# Patient Record
Sex: Female | Born: 1960 | Race: White | Hispanic: No | Marital: Married | Smoking: Never smoker
Health system: Southern US, Community
[De-identification: ages and names within clinical notes are randomized; demographics above are authoritative.]

## PROBLEM LIST (undated history)

## (undated) DIAGNOSIS — J302 Other seasonal allergic rhinitis: Secondary | ICD-10-CM

## (undated) HISTORY — PX: TONSILLECTOMY: SUR1361

## (undated) HISTORY — PX: OOPHORECTOMY: SHX86

## (undated) HISTORY — PX: BREAST SURGERY: SHX581

---

## 2021-11-30 ENCOUNTER — Encounter (HOSPITAL_BASED_OUTPATIENT_CLINIC_OR_DEPARTMENT_OTHER): Payer: Self-pay | Admitting: *Deleted

## 2021-11-30 ENCOUNTER — Emergency Department (HOSPITAL_BASED_OUTPATIENT_CLINIC_OR_DEPARTMENT_OTHER): Payer: 59

## 2021-11-30 ENCOUNTER — Emergency Department (HOSPITAL_BASED_OUTPATIENT_CLINIC_OR_DEPARTMENT_OTHER)
Admission: EM | Admit: 2021-11-30 | Discharge: 2021-12-01 | Disposition: A | Discharge status: Home / Self Care | Payer: 59 | Attending: Emergency Medicine | Admitting: Emergency Medicine

## 2021-11-30 ENCOUNTER — Other Ambulatory Visit: Payer: Self-pay

## 2021-11-30 DIAGNOSIS — Y92002 Bathroom of unspecified non-institutional (private) residence single-family (private) house as the place of occurrence of the external cause: Secondary | ICD-10-CM | POA: Insufficient documentation

## 2021-11-30 DIAGNOSIS — W182XXA Fall in (into) shower or empty bathtub, initial encounter: Secondary | ICD-10-CM | POA: Diagnosis not present

## 2021-11-30 DIAGNOSIS — S43014A Anterior dislocation of right humerus, initial encounter: Secondary | ICD-10-CM | POA: Insufficient documentation

## 2021-11-30 DIAGNOSIS — S43004A Unspecified dislocation of right shoulder joint, initial encounter: Secondary | ICD-10-CM

## 2021-11-30 DIAGNOSIS — R030 Elevated blood-pressure reading, without diagnosis of hypertension: Secondary | ICD-10-CM

## 2021-11-30 DIAGNOSIS — M25511 Pain in right shoulder: Secondary | ICD-10-CM | POA: Insufficient documentation

## 2021-11-30 DIAGNOSIS — W010XXA Fall on same level from slipping, tripping and stumbling without subsequent striking against object, initial encounter: Secondary | ICD-10-CM

## 2021-11-30 DIAGNOSIS — S4991XA Unspecified injury of right shoulder and upper arm, initial encounter: Secondary | ICD-10-CM | POA: Diagnosis present

## 2021-11-30 HISTORY — DX: Other seasonal allergic rhinitis: J30.2

## 2021-11-30 MED ORDER — HYDROCODONE-ACETAMINOPHEN 5-325 MG PO TABS
2.0000 | ORAL_TABLET | Freq: Once | ORAL | Status: AC
  Administered 2021-11-30: 2 via ORAL
  Filled 2021-11-30: qty 2

## 2021-11-30 MED ORDER — PROPOFOL 10 MG/ML IV BOLUS
INTRAVENOUS | Status: AC | PRN
  Administered 2021-11-30: 35 mg via INTRAVENOUS

## 2021-11-30 MED ORDER — PROPOFOL 10 MG/ML IV BOLUS
1.0000 mg/kg | Freq: Once | INTRAVENOUS | Status: AC
  Administered 2021-11-30: 72.6 mg via INTRAVENOUS
  Filled 2021-11-30: qty 20

## 2021-11-30 NOTE — Discharge Instructions (Addendum)
It was our pleasure to provide your ER care today - we hope that you feel better.  Wear sling/shoulder immobilizer for comfort/support. Ice/coldpack to sore area.   Take motrin or aleve as need for pain. You may also take hydrocodone as need for pain. No driving for the next 6 hours or when taking hydrocodone. Also, do not take tylenol or acetaminophen containing medication when taking hydrocodone.  Follow up with orthopedist in the next 1-2 weeks - call office tomorrow AM to arrange appointment.   Your blood pressure is high tonight, follow up with primary care doctor in the next couple weeks.   Return to ER if worse, new symptoms, new/intractable pain, numbness/weakness, or other concern.

## 2021-11-30 NOTE — ED Provider Notes (Signed)
[facility] EMERGENCY DEPARTMENT Provider Note   CSN: [id number] Arrival date & time: 11/30/21  2203     History  Chief Complaint  Patient presents with   Arm Injury    Kristine Morton is a 61 y.o. female.  Patient c/o injury to right shoulder/upper arm area today, just pta. Was in bathroom, feet slipped, tried to grab something to prevent fall with right arm/hand, and states all her weight and strain was placed onto right upper arm shoulder area. Did not fall all the way to ground, but did have acute pain in right upper arm/shoulder area - symptoms acute onset, constant, dull, non radiating. No head injury. No loc. Denies other pain or injury. No numbness/weakness. Skin intact.   The history is provided by the patient, the spouse and medical records.  Arm Injury Associated symptoms: no fever and no neck pain       Home Medications Prior to Admission medications   Not on File      Allergies    Patient has no known allergies.    Review of Systems   Review of Systems  Constitutional:  Negative for fever.  Respiratory:  Negative for shortness of breath.   Cardiovascular:  Negative for chest pain.  Musculoskeletal:  Negative for neck pain.       Right shoulder/upper arm pain/injury.   Skin:  Negative for rash.  Neurological:  Negative for weakness, numbness and headaches.   Physical Exam Updated Vital Signs BP (!) 170/85 (BP Location: Left Arm)    Pulse (!) 103    Temp 98.9 F (37.2 C) (Oral)    Resp 20    Ht 1.651 m (5\' 5" )    Wt 72.6 kg    SpO2 97%    BMI 26.63 kg/m  Physical Exam Vitals and nursing note reviewed.  Constitutional:      Appearance: Normal appearance. She is well-developed.  HENT:     Head: Atraumatic.     Nose: Nose normal.     Mouth/Throat:     Mouth: Mucous membranes are moist.  Eyes:     General: No scleral icterus.    Conjunctiva/sclera: Conjunctivae normal.     Pupils: Pupils are equal, round, and reactive to light.  Neck:      Trachea: No tracheal deviation.  Cardiovascular:     Rate and Rhythm: Normal rate.     Pulses: Normal pulses.  Pulmonary:     Effort: Pulmonary effort is normal. No respiratory distress.  Abdominal:     General: There is no distension.     Tenderness: There is no abdominal tenderness.  Genitourinary:    Comments: No cva tenderness.  Musculoskeletal:        General: No swelling.     Cervical back: Normal range of motion and neck supple. No rigidity or tenderness. No muscular tenderness.     Comments: CTLS spine, non tender, aligned, no step off. Tenderness right shoulder and upper arm diffusely. No gross soft tissue swelling noted. Compartments of arm are soft, not tense. Radial pulse palp. No other focal bony tenderness noted.   Skin:    General: Skin is warm and dry.     Findings: No rash.  Neurological:     Mental Status: She is alert.     Comments: Alert, speech normal.  RUE motor/sens grossly intact.   Psychiatric:        Mood and Affect: Mood normal.    ED Results / Procedures / Treatments  Labs (all labs ordered are listed, but only abnormal results are displayed) Labs Reviewed - No data to display  EKG None  Radiology No results found.  Procedures Procedures    Medications Ordered in ED Medications - No data to display  ED Course/ Medical Decision Making/ A&P                           Medical Decision Making Amount and/or Complexity of Data Reviewed Radiology: ordered.  Risk Prescription drug management.  Imaging ordered.   Reviewed nursing notes and prior charts for additional history. Spouse contributed to history. External reports reviewed.   Xrays reviewed/interpreted by me  - pnd  Considered parenteral med therapy, however, as no meds pta, will initially try oral pain control  therapy.   Hydrocodone po.   2300 xrays pending - signed out to Dr Blinda Leatherwood to check xrays, recheck pt, and dispo appropriately.            Final Clinical  Impression(s) / ED Diagnoses Final diagnoses:  None    Rx / DC Orders ED Discharge Orders     None         Cathren Laine, MD 11/30/21 2259

## 2021-11-30 NOTE — Sedation Documentation (Signed)
O2 increased to 6L  

## 2021-11-30 NOTE — ED Provider Notes (Signed)
Patient signed out to me by Dr. Denton Lank awaiting x-rays.  Patient with right shoulder injury after a near fall.  Physical Exam  BP (!) 171/84    Pulse 80    Temp 98.3 F (36.8 C) (Oral)    Resp 19    Ht 5\' 5"  (1.651 m)    Wt 72.6 kg    SpO2 100%    BMI 26.63 kg/m   Physical Exam Constitutional:      Appearance: Normal appearance.  HENT:     Head: Atraumatic.  Eyes:     Pupils: Pupils are equal, round, and reactive to light.  Cardiovascular:     Rate and Rhythm: Normal rate and regular rhythm.  Pulmonary:     Effort: Pulmonary effort is normal.     Breath sounds: Normal breath sounds.  Musculoskeletal:     Right shoulder: Deformity and tenderness present. Decreased range of motion.  Skin:    General: Skin is cool.     Findings: No bruising.  Neurological:     General: No focal deficit present.     Mental Status: She is alert.     Comments: Patient does have some subjective numbness on the outer portion of the deltoid    Procedures  .Sedation  Date/Time: 11/30/2021 11:44 PM Performed by: 12/02/2021, MD Authorized by: Gilda Crease, MD   Consent:    Consent obtained:  Written   Consent given by:  Patient   Risks discussed:  Allergic reaction, prolonged hypoxia resulting in organ damage, dysrhythmia, inadequate sedation, respiratory compromise necessitating ventilatory assistance and intubation, nausea and vomiting Universal protocol:    Procedure explained and questions answered to patient or proxy's satisfaction: yes     Relevant documents present and verified: yes     Test results available: yes     Imaging studies available: yes     Required blood products, implants, devices, and special equipment available: yes     Site/side marked: yes     Immediately prior to procedure, a time out was called: yes     Patient identity confirmed:  Verbally with patient Indications:    Procedure performed:  Dislocation reduction   Procedure necessitating sedation  performed by:  Physician performing sedation Pre-sedation assessment:    Time since last food or drink:  6   ASA classification: class 2 - patient with mild systemic disease     Mouth opening:  3 or more finger widths   Mallampati score:  III - soft palate, base of uvula visible   Neck mobility: normal     Pre-sedation assessments completed and reviewed: airway patency, cardiovascular function, hydration status, mental status, nausea/vomiting, pain level, respiratory function and temperature   Immediate pre-procedure details:    Reviewed: vital signs, relevant labs/tests and NPO status     Verified: bag valve mask available, emergency equipment available, intubation equipment available, IV patency confirmed, oxygen available, reversal medications available and suction available   Procedure details (see MAR for exact dosages):    Preoxygenation:  Nasal cannula   Sedation:  Propofol   Intended level of sedation: deep   Intra-procedure monitoring:  Blood pressure monitoring, continuous capnometry, frequent LOC assessments, frequent vital sign checks, continuous pulse oximetry and cardiac monitor   Intra-procedure events: none     Total Provider sedation time (minutes):  10 .Ortho Injury Treatment  Date/Time: 11/30/2021 11:46 PM Performed by: 12/02/2021, MD Authorized by: Gilda Crease, MD   Consent:  Consent obtained:  Written   Consent given by:  Patient   Risks discussed:  Fracture, nerve damage, restricted joint movement, vascular damage, irreducible dislocation, recurrent dislocation and stiffness Universal protocol:    Procedure explained and questions answered to patient or proxy's satisfaction: yes     Relevant documents present and verified: yes     Test results available and properly labeled: yes     Imaging studies available: yes     Required blood products, implants, devices, and special equipment available: yes     Site/side marked: yes      Immediately prior to procedure a time out was called: yes     Patient identity confirmed:  Verbally with patientInjury location: shoulder Location details: right shoulder Injury type: dislocation Dislocation type: anterior Chronicity: new Pre-procedure distal perfusion: normal Pre-procedure neurological function: diminished Pre-procedure range of motion: reduced  Anesthesia: Local anesthesia used: no  Patient sedated: Yes. Refer to sedation procedure documentation for details of sedation. Manipulation performed: yes Reduction method: external rotation Immobilization: sling Splint Applied by: ED Tech Post-procedure distal perfusion: normal Post-procedure neurological function: normal Post-procedure range of motion: improved    ED Course / MDM    Medical Decision Making Amount and/or Complexity of Data Reviewed Radiology: ordered.  Risk Prescription drug management.   Patient signed out to me to follow-up on imaging after right shoulder injury.  X-ray shows anterior dislocation.  Patient consented to sedation and closed reduction.  Prior to the procedure she had some numbness of the deltoid area concerning for axillary nerve injury.  After reduction, however, sensation has returned.  Will follow-up with Ortho.  Immobilizer continuously until follow-up.        Gilda Crease, MD 12/01/21 0003

## 2021-11-30 NOTE — ED Triage Notes (Signed)
States she fell in shower and caught herself with right arm. States all weight went onto right arm/shoulder. Limited movement, pain with movement

## 2021-12-01 MED ORDER — DIAZEPAM 5 MG PO TABS: 5.0000 mg | ORAL_TABLET | Freq: Three times a day (TID) | ORAL | 0 refills | Status: AC | PRN

## 2021-12-01 MED ORDER — OXYCODONE-ACETAMINOPHEN 5-325 MG PO TABS
1.0000 | ORAL_TABLET | ORAL | 0 refills | Status: AC | PRN
Start: 2021-12-01 — End: ?

## 2021-12-01 MED ORDER — DIAZEPAM 5 MG PO TABS
5.0000 mg | ORAL_TABLET | Freq: Once | ORAL | Status: AC
  Administered 2021-12-01: 5 mg via ORAL
  Filled 2021-12-01: qty 1

## 2023-08-21 IMAGING — DX DG SHOULDER 1V*R*
1 series · 1 of 1 positions shown · non-contrast
Comparison: 11/30/2021 at 5506 hours

CLINICAL DATA: Post reduction

EXAM:
RIGHT SHOULDER - 1 VIEW

[shoulder ap]
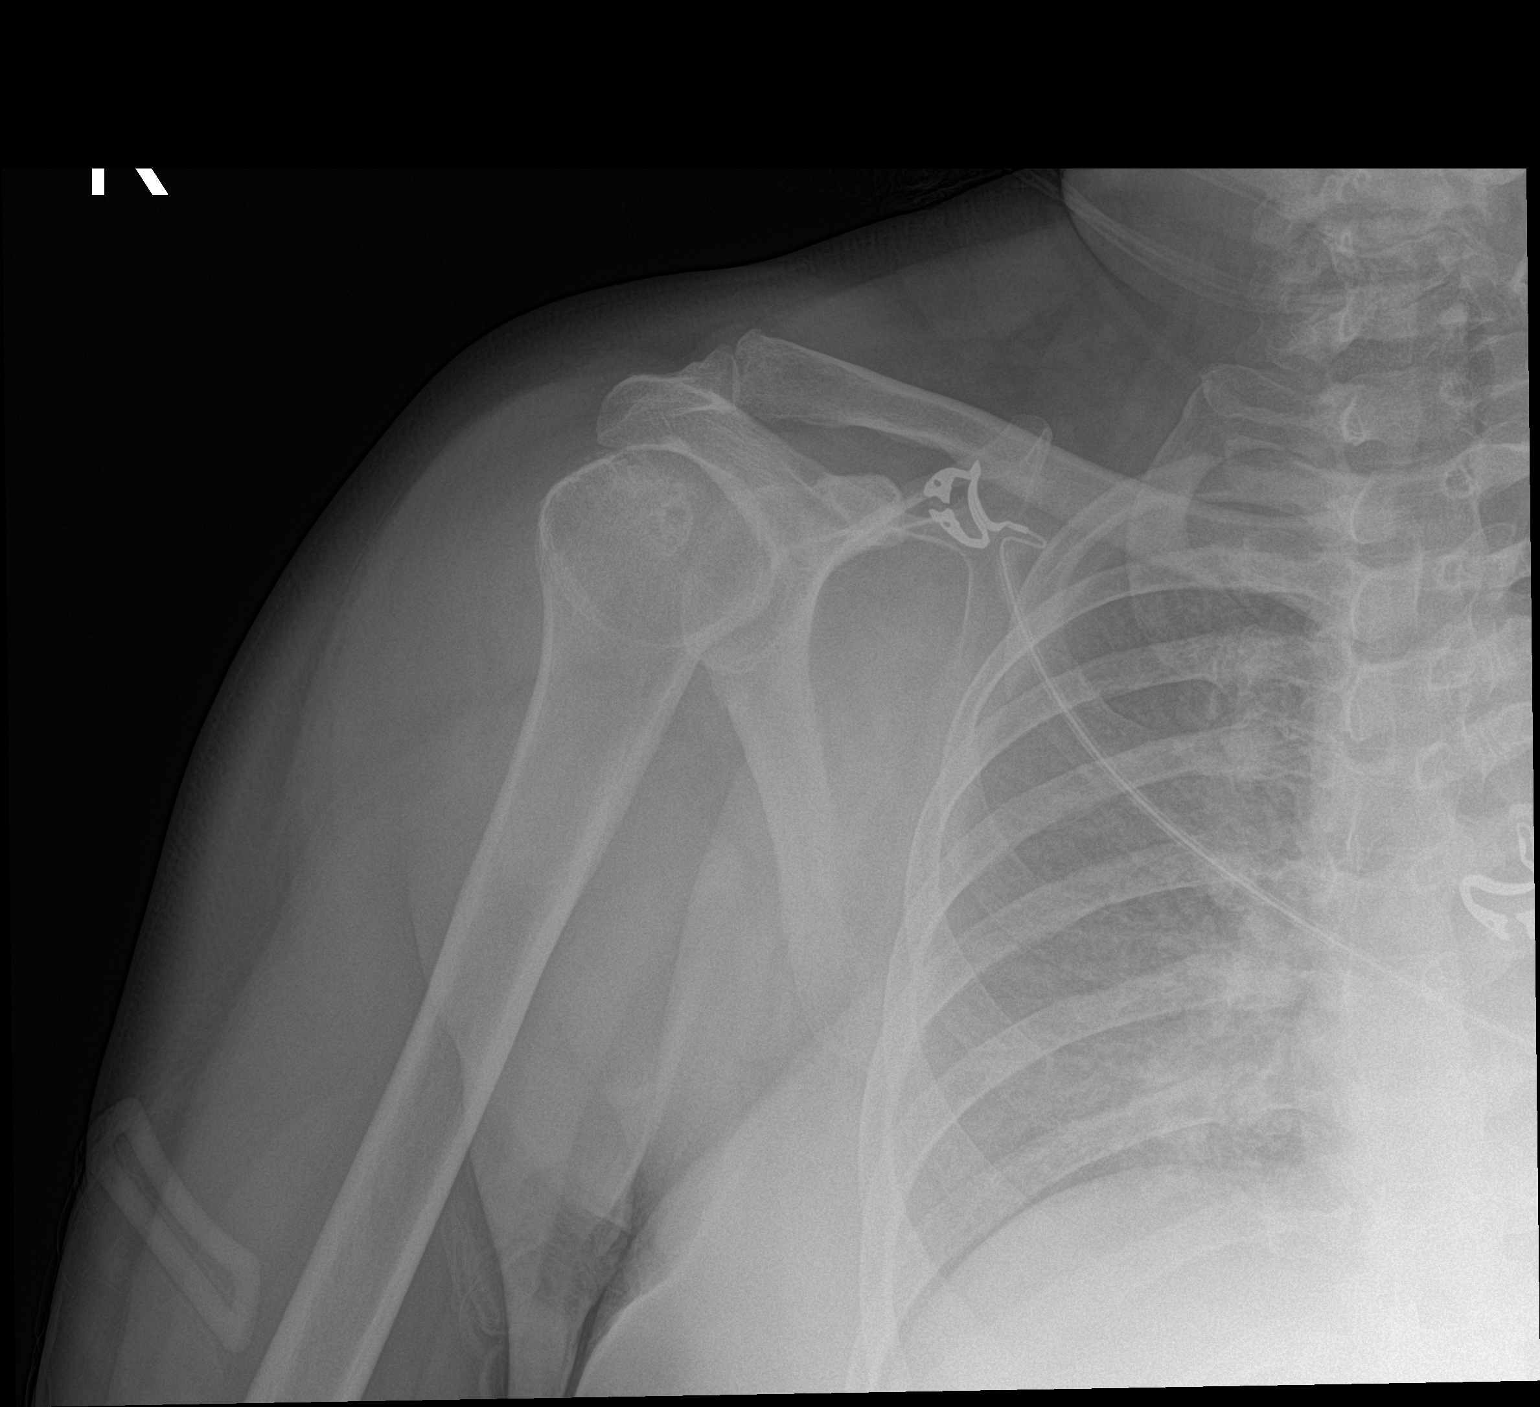

[1 of 1 positions shown; findings below may reference images not displayed]

FINDINGS: Satisfactory position of the right humeral head in the glenoid fossa
on this single frontal radiograph.

No fracture is seen.

Visualized soft tissues are within normal limits.

Visualized right lung is clear.
IMPRESSION: Interval right shoulder reduction.

## 2023-08-21 IMAGING — DX DG HUMERUS 2V *R*
2 series · 2 of 2 positions shown · non-contrast
Comparison: None.

CLINICAL DATA: Recent slip and fall with right arm pain, initial
encounter

EXAM:
RIGHT HUMERUS - 2+ VIEW

[humerus lat]
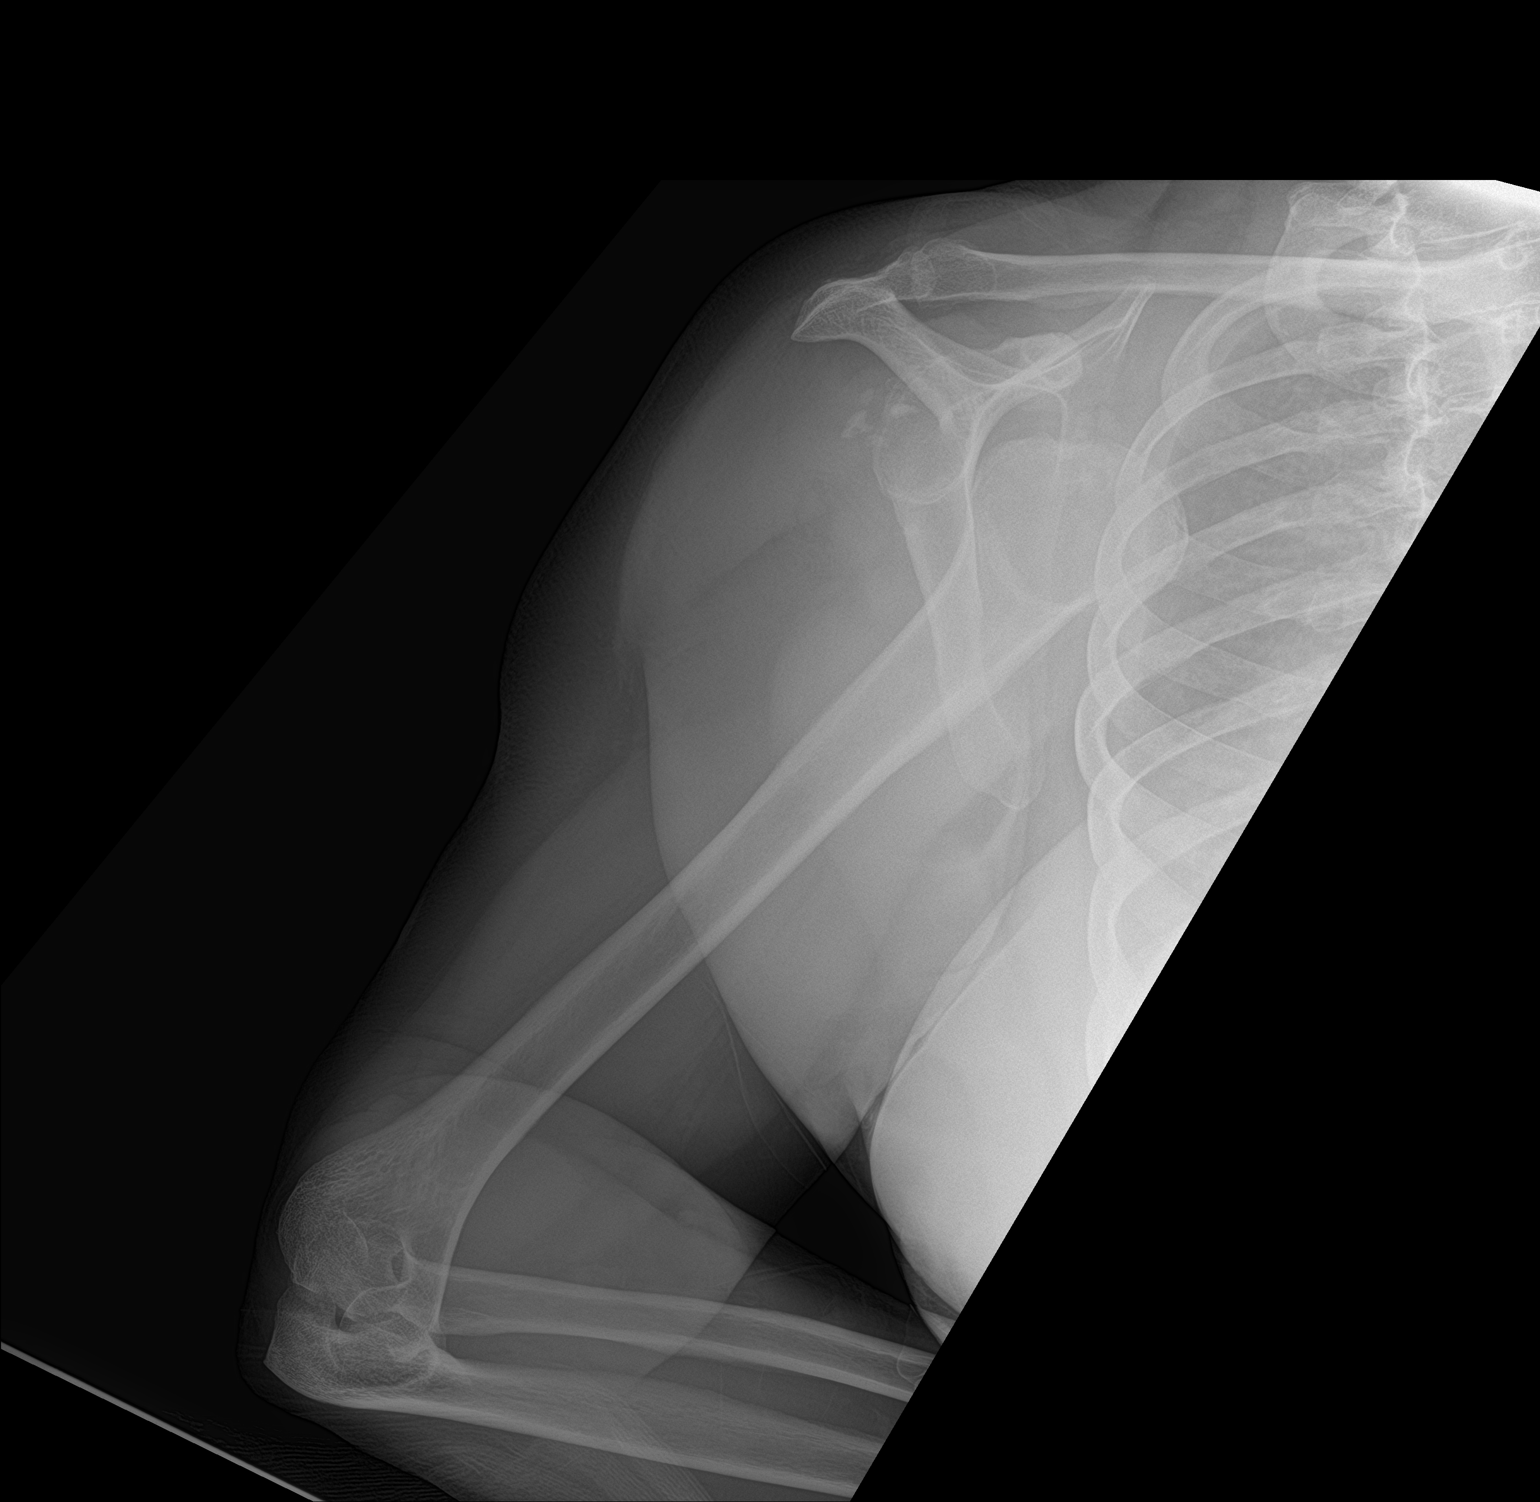

[humerus ap]
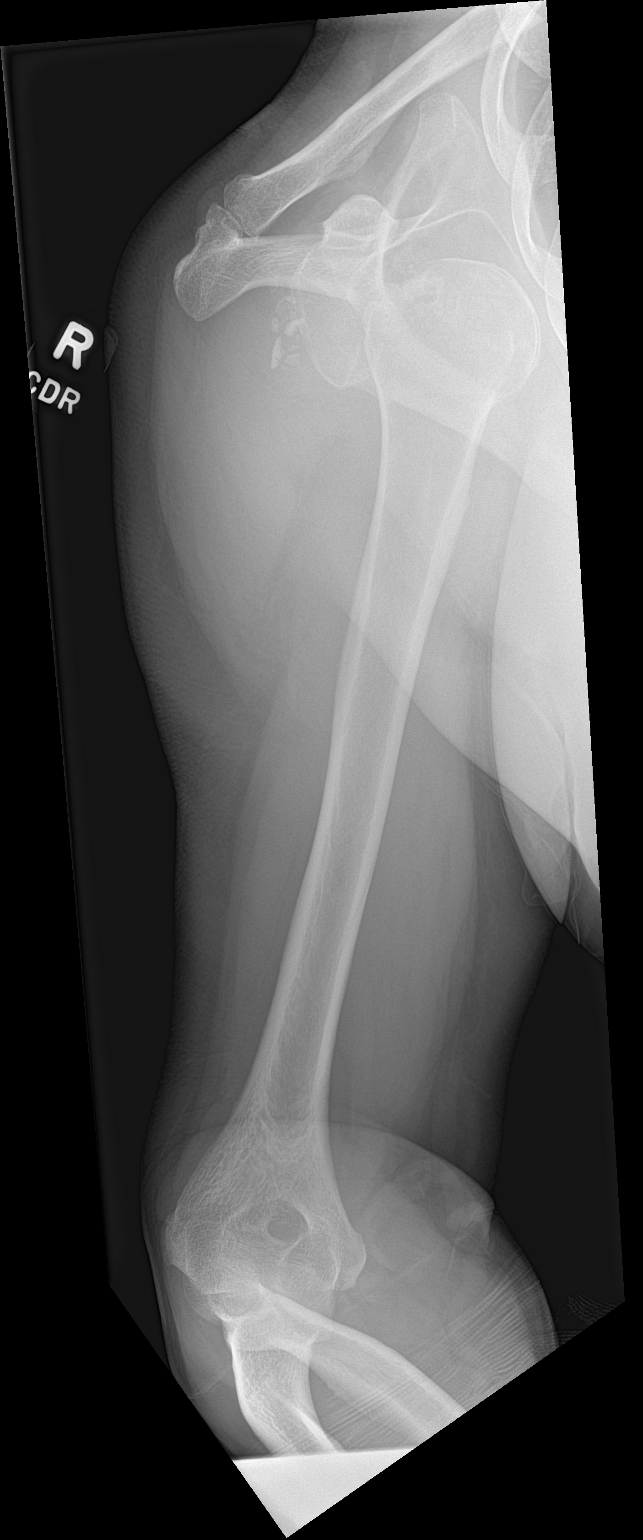

[2 of 2 positions shown; findings below may reference images not displayed]

FINDINGS: There is anterior inferior dislocation of the humeral head with
respect to the glenoid labrum. Multiple bony densities are noted
adjacent to the glenoid likely representing small bone fragments.
Possibly from the region of the greater tuberosity.
IMPRESSION: Anterior inferior dislocation of the humeral head with multiple
small avulsions identified.
